# Patient Record
Sex: Male | Born: 2000 | Race: Black or African American | Hispanic: No | Marital: Single | Smoking: Never smoker
Health system: Southern US, Community
[De-identification: ages and names within clinical notes are randomized; demographics above are authoritative.]

## PROBLEM LIST (undated history)

## (undated) DIAGNOSIS — J45909 Unspecified asthma, uncomplicated: Secondary | ICD-10-CM

## (undated) DIAGNOSIS — E119 Type 2 diabetes mellitus without complications: Secondary | ICD-10-CM

## (undated) HISTORY — DX: Type 2 diabetes mellitus without complications: E11.9

---
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
## 2022-01-05 ENCOUNTER — Other Ambulatory Visit: Payer: Self-pay

## 2022-01-05 ENCOUNTER — Ambulatory Visit: Payer: Medicaid Other | Admitting: Sports Medicine

## 2022-01-27 ENCOUNTER — Ambulatory Visit: Payer: Medicaid Other | Admitting: Podiatry

## 2022-02-01 ENCOUNTER — Encounter: Payer: Self-pay | Admitting: Podiatry

## 2022-02-01 ENCOUNTER — Ambulatory Visit (INDEPENDENT_AMBULATORY_CARE_PROVIDER_SITE_OTHER): Payer: Medicaid Other | Admitting: Podiatry

## 2022-02-01 ENCOUNTER — Other Ambulatory Visit: Payer: Self-pay

## 2022-02-01 DIAGNOSIS — L600 Ingrowing nail: Secondary | ICD-10-CM

## 2022-02-01 NOTE — Progress Notes (Signed)
°  Subjective:  Patient ID: Jason Sherman, male    DOB: [birth year],   MRN: [id number]  Chief Complaint  Patient presents with   Ingrown Toenail    Pt came in today for a left ingrown toenail, which has started 5 mths ago, the pt has some swelling and drainage for the hallux.     21 y.o. male presents for concern of left great ingrown toenail that started about 6 months ago and has worsened. Has been swelling and draining. Has been soaking with no relief.  . Denies any other pedal complaints. Denies n/v/f/c.   Past Medical History:  Diagnosis Date   Asthma     Objective:  Physical Exam: Vascular: DP/PT pulses 2/4 bilateral. CFT <3 seconds. Normal hair growth on digits. No edema.  Skin. No lacerations or abrasions bilateral feet. Incurvation of lateral border of left hallux. Some erythema and edema noted no purulence.  Musculoskeletal: MMT 5/5 bilateral lower extremities in DF, PF, Inversion and Eversion. Deceased ROM in DF of ankle joint.  Neurological: Sensation intact to light touch.   Assessment:   1. Ingrown nail of great toe of left foot      Plan:  Patient was evaluated and treated and all questions answered. Patient requesting removal of ingrown nail today. Procedure below.  Discussed procedure and post procedure care and patient expressed understanding.  Will follow-up in 2 weeks for nail check or sooner if any problems arise.    Procedure:  Procedure: partial Nail Avulsion of left hallux lateral nail border.  Surgeon: Louann Sjogren, DPM  Pre-op Dx: Ingrown toenail with infection Post-op: Same  Place of Surgery: Office exam room.  Indications for surgery: Painful and ingrown toenail.    The patient is requesting removal of nail with chemical matrixectomy. Risks and complications were discussed with the patient for which they understand and written consent was obtained. Under sterile conditions a total of 3 mL of  1% lidocaine plain was infiltrated in a hallux block  fashion. Once anesthetized, the skin was prepped in sterile fashion. A tourniquet was then applied. Next the lateral aspect of hallux nail border was then sharply excised making sure to remove the entire offending nail border.  Next phenol was then applied under standard conditions and copiously irrigated. Silvadene was applied. A dry sterile dressing was applied. After application of the dressing the tourniquet was removed and there is found to be an immediate capillary refill time to the digit. The patient tolerated the procedure well without any complications. Post procedure instructions were discussed the patient for which he verbally understood. Follow-up in two weeks for nail check or sooner if any problems are to arise. Discussed signs/symptoms of infection and directed to call the office immediately should any occur or go directly to the emergency room. In the meantime, encouraged to call the office with any questions, concerns, changes symptoms.   Louann Sjogren, DPM

## 2022-02-01 NOTE — Patient Instructions (Signed)

## 2022-02-21 ENCOUNTER — Ambulatory Visit (INDEPENDENT_AMBULATORY_CARE_PROVIDER_SITE_OTHER): Payer: Medicaid Other | Admitting: Podiatry

## 2022-02-21 DIAGNOSIS — Z91199 Patient's noncompliance with other medical treatment and regimen due to unspecified reason: Secondary | ICD-10-CM

## 2022-02-21 NOTE — Progress Notes (Signed)
No show

## 2022-10-31 ENCOUNTER — Emergency Department (HOSPITAL_COMMUNITY)
Admission: EM | Admit: 2022-10-31 | Discharge: 2022-10-31 | Disposition: A | Discharge status: Home / Self Care | Payer: Medicaid Other | Attending: Emergency Medicine | Admitting: Emergency Medicine

## 2022-10-31 ENCOUNTER — Other Ambulatory Visit: Payer: Self-pay

## 2022-10-31 ENCOUNTER — Encounter (HOSPITAL_COMMUNITY): Payer: Self-pay

## 2022-10-31 DIAGNOSIS — R1084 Generalized abdominal pain: Secondary | ICD-10-CM | POA: Insufficient documentation

## 2022-10-31 DIAGNOSIS — R809 Proteinuria, unspecified: Secondary | ICD-10-CM | POA: Insufficient documentation

## 2022-10-31 DIAGNOSIS — R21 Rash and other nonspecific skin eruption: Secondary | ICD-10-CM | POA: Insufficient documentation

## 2022-10-31 LAB — COMPREHENSIVE METABOLIC PANEL
ALT: 40 U/L (ref 0–44)
AST: 22 U/L (ref 15–41)
Albumin: 4.6 g/dL (ref 3.5–5.0)
Alkaline Phosphatase: 46 U/L (ref 38–126)
Anion gap: 8 (ref 5–15)
BUN: 12 mg/dL (ref 6–20)
CO2: 26 mmol/L (ref 22–32)
Calcium: 9.4 mg/dL (ref 8.9–10.3)
Chloride: 102 mmol/L (ref 98–111)
Creatinine, Ser: 1.04 mg/dL (ref 0.61–1.24)
GFR, Estimated: >60 mL/min (ref >60)
Glucose, Bld: 111 mg/dL — ABNORMAL HIGH (ref 70–99)
Potassium: 4.1 mmol/L (ref 3.5–5.1)
Sodium: 136 mmol/L (ref 135–145)
Total Bilirubin: 0.5 mg/dL (ref 0.3–1.2)
Total Protein: 8 g/dL (ref 6.5–8.1)

## 2022-10-31 LAB — CBC WITH DIFFERENTIAL/PLATELET
Abs Immature Granulocytes: 0.06 10*3/uL (ref 0.00–0.07)
Basophils Absolute: 0 10*3/uL (ref 0.0–0.1)
Basophils Relative: 0 %
Eosinophils Absolute: 0.1 10*3/uL (ref 0.0–0.5)
Eosinophils Relative: 2 %
HCT: 50.3 % (ref 39.0–52.0)
Hemoglobin: 16.6 g/dL (ref 13.0–17.0)
Immature Granulocytes: 1 %
Lymphocytes Relative: 11 %
Lymphs Abs: 0.6 10*3/uL — ABNORMAL LOW (ref 0.7–4.0)
MCH: 28 pg (ref 26.0–34.0)
MCHC: 33 g/dL (ref 30.0–36.0)
MCV: 84.8 fL (ref 80.0–100.0)
Monocytes Absolute: 0.7 10*3/uL (ref 0.1–1.0)
Monocytes Relative: 12 %
Neutro Abs: 4.4 10*3/uL (ref 1.7–7.7)
Neutrophils Relative %: 74 %
Platelets: 188 10*3/uL (ref 150–400)
RBC: 5.93 MIL/uL — ABNORMAL HIGH (ref 4.22–5.81)
RDW: 12.5 % (ref 11.5–15.5)
WBC: 6 10*3/uL (ref 4.0–10.5)
nRBC: 0 % (ref 0.0–0.2)

## 2022-10-31 LAB — URINALYSIS, ROUTINE W REFLEX MICROSCOPIC
Bacteria, UA: NONE SEEN
Bilirubin Urine: NEGATIVE
Glucose, UA: NEGATIVE mg/dL
Hgb urine dipstick: NEGATIVE
Ketones, ur: NEGATIVE mg/dL
Leukocytes,Ua: NEGATIVE
Nitrite: NEGATIVE
Protein, ur: 30 mg/dL — AB
Specific Gravity, Urine: 1.023 (ref 1.005–1.030)
pH: 7 (ref 5.0–8.0)

## 2022-10-31 LAB — LIPASE, BLOOD: Lipase: 24 U/L (ref 11–51)

## 2022-10-31 MED ORDER — CEPHALEXIN 500 MG PO CAPS: 500.0000 mg | ORAL_CAPSULE | Freq: Four times a day (QID) | ORAL | 0 refills | Status: DC

## 2022-10-31 MED ORDER — HYDROCORTISONE 1 % EX CREA: TOPICAL_CREAM | CUTANEOUS | 0 refills | Status: AC

## 2022-10-31 NOTE — ED Triage Notes (Signed)
Pt presents to ED with multiple complaints. Pt c/o generalized abdominal pain, headache since last night. Denies N/V/D  Pt also with rash to left hand x 1 month around recent tattoo. Pt states came up after his tattoo 1 month ago, denies itching.

## 2022-10-31 NOTE — Discharge Instructions (Signed)
Please refer to the attached instructions. Take medication as directed. 

## 2022-10-31 NOTE — ED Provider Notes (Signed)
[facility] EMERGENCY DEPARTMENT Provider Note   CSN: [id number] Arrival date & time: 10/31/22  3086     History  Chief Complaint  Patient presents with   Multiple Complaints    Jason Sherman is a 21 y.o. male.  21 year old with complaint of headache and generalized abdominal pain, nausea, no emesis or diarrhea, onset after eating a large meal last night. Denies fever/chills. Also complaining of rash to left hand, onset after receiving a tattoo one month ago.   The history is provided by the patient. No language interpreter was used.  Abdominal Pain Pain location:  Generalized Pain quality: gnawing   Pain radiates to:  Does not radiate Pain severity:  Moderate Onset quality:  Gradual Duration:  12 hours Timing:  Intermittent Progression:  Waxing and waning Chronicity:  New Context: eating   Associated symptoms: nausea   Associated symptoms: no chills, no diarrhea, no fever and no vomiting   Rash Location:  Hand Hand rash location:  Dorsum of L hand Quality: blistering   Quality: not itchy, not red and not swelling   Severity:  Mild Onset quality:  Gradual Duration:  1 month Timing:  Constant Progression:  Unchanged Chronicity:  New Context comment:  Post tattoo Associated symptoms: abdominal pain and nausea   Associated symptoms: no diarrhea, no fever and not vomiting        Home Medications Prior to Admission medications   Medication Sig Start Date End Date Taking? Authorizing Provider  acetaminophen (TYLENOL) 325 MG tablet Take 1 tablet (325 mg total) by mouth every 6 (six) hours as needed (mild pain, fever >100.4). 02/17/15   Haney, Arlyss Repress A, MD  albuterol (PROVENTIL HFA;VENTOLIN HFA) 108 (90 BASE) MCG/ACT inhaler Inhale 4 puffs into the lungs every 4 (four) hours. 02/17/15   Haney, Jeanann Lewandowsky, MD  albuterol (PROVENTIL HFA;VENTOLIN HFA) 108 (90 Base) MCG/ACT inhaler Inhale 2 puffs into the lungs every 4 (four) hours as needed for wheezing or shortness of breath.  04/09/18   Ronnell Freshwater, NP  atorvastatin (LIPITOR) 40 MG tablet Take 40 mg by mouth daily. 11/23/21   [provider]  cefdinir (OMNICEF) 125 MG/5ML suspension Take 12 mLs (300 mg total) by mouth 2 (two) times daily. 02/17/15   Bonney Aid, MD  Cephalexin 500 MG tablet Take 500 mg by mouth 2 (two) times daily. 11/23/21   [provider]  cetirizine (ZYRTEC) 10 MG tablet Take 10 mg by mouth daily. 10/10/21   [provider]  ibuprofen (ADVIL) 600 MG tablet Take 600 mg by mouth 3 (three) times daily as needed. 11/23/21   [provider]  metFORMIN (GLUCOPHAGE) 500 MG tablet Take 500 mg by mouth 2 (two) times daily. 11/24/21   [provider]  predniSONE (DELTASONE) 20 MG tablet Take 3 tablets (60 mg total) by mouth daily with breakfast. 02/17/15   Haney, Alyssa A, MD  Vitamin D, Ergocalciferol, (DRISDOL) 1.25 MG (50000 UNIT) CAPS capsule Take 50,000 Units by mouth once a week. 11/23/21   [provider]      Allergies    Cashew nut oil, Penicillins, and Amoxicillin    Review of Systems   Review of Systems  Constitutional:  Negative for chills and fever.  Gastrointestinal:  Positive for abdominal pain and nausea. Negative for diarrhea and vomiting.  Skin:  Positive for rash.  All other systems reviewed and are negative.   Physical Exam Updated Vital Signs BP 128/72 (BP Location: Right Arm)  Pulse 83   Temp 99.7 F (37.6 C) (Oral)   Resp 16   Ht 5\' 8"  (1.727 m)   Wt 81.6 kg   SpO2 96%   BMI 27.37 kg/m  Physical Exam Constitutional:      Appearance: Normal appearance.  HENT:     Head: Normocephalic.     Nose: Nose normal.     Mouth/Throat:     Mouth: Mucous membranes are moist.  Eyes:     Extraocular Movements: Extraocular movements intact.     Pupils: Pupils are equal, round, and reactive to light.  Cardiovascular:     Rate and Rhythm: Normal rate.  Pulmonary:     Effort: Pulmonary effort is normal.   Abdominal:     General: There is no distension.     Palpations: Abdomen is soft.     Tenderness: There is no guarding.  Musculoskeletal:        General: Normal range of motion.  Skin:    General: Skin is warm and dry.     Findings: Rash present.  Neurological:     Mental Status: He is alert and oriented to person, place, and time.  Psychiatric:        Mood and Affect: Mood normal.        Behavior: Behavior normal.     ED Results / Procedures / Treatments   Labs (all labs ordered are listed, but only abnormal results are displayed) Labs Reviewed  COMPREHENSIVE METABOLIC PANEL - Abnormal; Notable for the following components:      Result Value   Glucose, Bld 111 (*)    All other components within normal limits  CBC WITH DIFFERENTIAL/PLATELET - Abnormal; Notable for the following components:   RBC 5.93 (*)    Lymphs Abs 0.6 (*)    All other components within normal limits  URINALYSIS, ROUTINE W REFLEX MICROSCOPIC - Abnormal; Notable for the following components:   Protein, ur 30 (*)    All other components within normal limits  LIPASE, BLOOD    EKG None  Radiology No results found.  Procedures Procedures    Medications Ordered in ED Medications - No data to display  ED Course/ Medical Decision Making/ A&P                           Medical Decision Making    1) hand rash: Sensitivity reaction versus mild underlying infection. Will treat with topical steroid and keflex  2) Abdominal pain: Patient is nontoxic, nonseptic appearing, in no apparent distress.  Labs and vitals reviewed.  Patient does not meet the SIRS or Sepsis criteria.  On repeat exam patient does not have a surgical abdomen and there are no peritoneal signs.  Patient discharged home with symptomatic treatment and given strict instructions for follow-up with their primary care physician.  I have also discussed reasons to return immediately to the ER.  Patient expresses understanding and agrees with  plan.     Amount and/or Complexity of Data Reviewed Labs: ordered. Decision-making details documented in ED Course.    Details: Mild protenuria. Negative lipase.  Risk Prescription drug management.           Final Clinical Impression(s) / ED Diagnoses Final diagnoses:  Generalized abdominal pain  Rash and nonspecific skin eruption    Rx / DC Orders ED Discharge Orders          Ordered    cephALEXin (KEFLEX) 500 MG capsule  4 times daily        10/31/22 1137    hydrocortisone cream 1 %        10/31/22 1137              Felicie Morn, NP 10/31/22 Ernestina Columbia    Terrilee Files, MD 11/01/22 1715

## 2023-01-04 ENCOUNTER — Ambulatory Visit: Payer: Self-pay | Admitting: Internal Medicine

## 2023-02-02 ENCOUNTER — Ambulatory Visit: Payer: Self-pay | Admitting: Internal Medicine

## 2023-04-30 ENCOUNTER — Ambulatory Visit
Admission: EM | Admit: 2023-04-30 | Discharge: 2023-04-30 | Disposition: A | Discharge status: Home / Self Care | Payer: Self-pay | Attending: Nurse Practitioner | Admitting: Nurse Practitioner

## 2023-04-30 DIAGNOSIS — L02214 Cutaneous abscess of groin: Secondary | ICD-10-CM

## 2023-04-30 MED ORDER — SULFAMETHOXAZOLE-TRIMETHOPRIM 800-160 MG PO TABS: 1.0000 | ORAL_TABLET | Freq: Two times a day (BID) | ORAL | 0 refills | Status: AC

## 2023-04-30 NOTE — ED Provider Notes (Signed)
[facility]    CSN: [id number] Arrival date & time: 04/30/23  1104      History   Chief Complaint Chief Complaint  Patient presents with   Mass    HPI Jason Sherman is a 22 y.o. male.   Patient presents today with a swollen and painful area to left thigh.  He denies redness, drainage, or worsening swelling.  Denies fevers or nausea/vomiting.  Patient reports he applied a warm compress and this seemed to help a little bit.  Thinks it has gone down some in size.  No recent change in detergents, soaps, personal care products.  Patient denies antibiotic use in the past 90 days.    Past Medical History:  Diagnosis Date   Asthma    Diabetes mellitus without complication [facility])     Patient Active Problem List   Diagnosis Date Noted   CAP (community acquired pneumonia) 02/17/2015   Pneumonia 02/17/2015   Asthma exacerbation    Hypoxia     History reviewed. No pertinent surgical history.     Home Medications    Prior to Admission medications   Medication Sig Start Date End Date Taking? Authorizing Provider  hydrocortisone cream 1 % Apply to affected area 2 times daily 10/31/22  Yes Felicie Morn, NP  sulfamethoxazole-trimethoprim (BACTRIM DS) 800-160 MG tablet Take 1 tablet by mouth 2 (two) times daily for 7 days. 04/30/23 05/07/23 Yes Valentino Nose, NP  acetaminophen (TYLENOL) 325 MG tablet Take 1 tablet (325 mg total) by mouth every 6 (six) hours as needed (mild pain, fever >100.4). 02/17/15   Haney, Jeanann Lewandowsky, MD  albuterol (PROVENTIL HFA;VENTOLIN HFA) 108 (90 Base) MCG/ACT inhaler Inhale 2 puffs into the lungs every 4 (four) hours as needed for wheezing or shortness of breath. Patient not taking: Reported on 10/31/2022 04/09/18   Ronnell Freshwater, NP  atorvastatin (LIPITOR) 40 MG tablet Take 40 mg by mouth daily. Patient not taking: Reported on 10/31/2022 11/23/21   [provider]  metFORMIN (GLUCOPHAGE) 500 MG tablet Take 500 mg by  mouth 2 (two) times daily. Patient not taking: Reported on 10/31/2022 11/24/21   [provider]    Family History History reviewed. No pertinent family history.  Social History Social History   Tobacco Use   Smoking status: Never   Smokeless tobacco: Never  Substance Use Topics   Alcohol use: Yes   Drug use: No     Allergies   Cashew nut oil, Penicillins, and Amoxicillin   Review of Systems Review of Systems Per HPI  Physical Exam Triage Vital Signs ED Triage Vitals [04/30/23 1314]  Enc Vitals Group     BP 137/75     Pulse Rate (!) 55     Resp 16     Temp 97.8 F (36.6 C)     Temp Source Oral     SpO2 98 %     Weight      Height      Head Circumference      Peak Flow      Pain Score 3     Pain Loc      Pain Edu?      Excl. in GC?    No data found.  Updated Vital Signs BP 137/75 (BP Location: Right Arm)   Pulse (!) 55   Temp 97.8 F (36.6 C) (Oral)   Resp 16   SpO2 98%   Visual Acuity Right Eye Distance:   Left Eye Distance:  Bilateral Distance:    Right Eye Near:   Left Eye Near:    Bilateral Near:     Physical Exam Vitals and nursing note reviewed.  Constitutional:      General: He is not in acute distress.    Appearance: Normal appearance. He is not toxic-appearing.  HENT:     Mouth/Throat:     Mouth: Mucous membranes are moist.     Pharynx: Oropharynx is clear.  Pulmonary:     Effort: Pulmonary effort is normal. No respiratory distress.  Skin:    General: Skin is warm and dry.     Capillary Refill: Capillary refill takes less than 2 seconds.     Findings: Abscess present.     Comments: Flesh colored fluctuant, tender to touch mass to left groin approximately 0.5 x 0.5 cm.  No active drainage, erythema, warmth.  Neurological:     Mental Status: He is alert and oriented to person, place, and time.  Psychiatric:        Behavior: Behavior is cooperative.      UC Treatments / Results  Labs (all labs ordered are  listed, but only abnormal results are displayed) Labs Reviewed - No data to display  EKG   Radiology No results found.  Procedures Procedures (including critical care time)  Medications Ordered in UC Medications - No data to display  Initial Impression / Assessment and Plan / UC Course  I have reviewed the triage vital signs and the nursing notes.  Pertinent labs & imaging results that were available during my care of the patient were reviewed by me and considered in my medical decision making (see chart for details).   Patient is well-appearing, normotensive, afebrile, not tachycardic, not tachypneic, oxygenating well on room air.    1. Abscess of left groin Offered I&D vs. beginning antibiotic therapy and close monitoring Patient elects to start oral antibiotic therapy and continue warm compresses, closely monitor for worsening symptoms and will return if so for I&D Supportive care discussed Strict ER and return precautions also discussed with patient  The patient was given the opportunity to ask questions.  All questions answered to their satisfaction.  The patient is in agreement to this plan.    Final Clinical Impressions(s) / UC Diagnoses   Final diagnoses:  Abscess of left groin     Discharge Instructions      Please continue the warm compresses to the abscess to help with pain and allow it to drain.  Please also start the Bactrim and take it as prescribed to treat infection under the skin.  Please return to be seen if the area starts to swell worse or becomes more painful.     ED Prescriptions     Medication Sig Dispense Auth. Provider   sulfamethoxazole-trimethoprim (BACTRIM DS) 800-160 MG tablet Take 1 tablet by mouth 2 (two) times daily for 7 days. 14 tablet Valentino Nose, NP      PDMP not reviewed this encounter.   Valentino Nose, NP 04/30/23 1349

## 2023-04-30 NOTE — Discharge Instructions (Signed)
Please continue the warm compresses to the abscess to help with pain and allow it to drain.  Please also start the Bactrim and take it as prescribed to treat infection under the skin.  Please return to be seen if the area starts to swell worse or becomes more painful.

## 2023-04-30 NOTE — ED Triage Notes (Signed)
Pt states he noticed a boil on his left thigh a week ago. Pt states it has not gotten any bigger and no redness or warmth to site with no drainage. Does state it is painful to the touch.

## 2023-11-09 ENCOUNTER — Other Ambulatory Visit: Payer: Self-pay

## 2023-11-09 ENCOUNTER — Encounter (HOSPITAL_COMMUNITY): Payer: Self-pay

## 2023-11-09 ENCOUNTER — Emergency Department (HOSPITAL_COMMUNITY)
Admission: EM | Admit: 2023-11-09 | Discharge: 2023-11-09 | Disposition: A | Discharge status: Home / Self Care | Payer: Self-pay | Attending: Emergency Medicine | Admitting: Emergency Medicine

## 2023-11-09 DIAGNOSIS — A749 Chlamydial infection, unspecified: Secondary | ICD-10-CM | POA: Insufficient documentation

## 2023-11-09 DIAGNOSIS — N342 Other urethritis: Secondary | ICD-10-CM

## 2023-11-09 LAB — URINALYSIS, ROUTINE W REFLEX MICROSCOPIC
Bacteria, UA: NONE SEEN
Bilirubin Urine: NEGATIVE
Glucose, UA: NEGATIVE mg/dL
Ketones, ur: NEGATIVE mg/dL
Nitrite: NEGATIVE
Protein, ur: NEGATIVE mg/dL
Specific Gravity, Urine: 1.004 — ABNORMAL LOW (ref 1.005–1.030)
WBC, UA: >50 WBC/hpf (ref 0–5)
pH: 7 (ref 5.0–8.0)

## 2023-11-09 MED ORDER — DOXYCYCLINE HYCLATE 100 MG PO TABS
100.0000 mg | ORAL_TABLET | Freq: Once | ORAL | Status: AC
  Administered 2023-11-09: 100 mg via ORAL
  Filled 2023-11-09: qty 1

## 2023-11-09 MED ORDER — DOXYCYCLINE HYCLATE 100 MG PO CAPS: 100.0000 mg | ORAL_CAPSULE | Freq: Two times a day (BID) | ORAL | 0 refills | Status: AC

## 2023-11-09 MED ORDER — CEFTRIAXONE SODIUM 500 MG IJ SOLR
500.0000 mg | Freq: Once | INTRAMUSCULAR | Status: AC
  Administered 2023-11-09: 500 mg via INTRAMUSCULAR
  Filled 2023-11-09: qty 500

## 2023-11-09 MED ORDER — STERILE WATER FOR INJECTION IJ SOLN
INTRAMUSCULAR | Status: AC
  Administered 2023-11-09: 1 mL
  Filled 2023-11-09: qty 10

## 2023-11-09 NOTE — ED Provider Notes (Signed)
[facility] EMERGENCY DEPARTMENT AT [facility] Provider Note   CSN: [id number] Arrival date & time: 11/09/23  2018     History  Chief Complaint  Patient presents with   Exposure to STD    Jason Sherman is a 22 y.o. male.  Presents for urethral discharge that he noticed today while at work.  Denies any dysuria or fever, no flank pain, no nausea or vomiting, no other symptoms.  He is sexually active with 1 partner only, does not use condoms.   Exposure to STD       Home Medications Prior to Admission medications   Medication Sig Start Date End Date Taking? Authorizing Provider  acetaminophen (TYLENOL) 325 MG tablet Take 1 tablet (325 mg total) by mouth every 6 (six) hours as needed (mild pain, fever >100.4). 02/17/15   Haney, Jeanann Lewandowsky, MD  albuterol (PROVENTIL HFA;VENTOLIN HFA) 108 (90 Base) MCG/ACT inhaler Inhale 2 puffs into the lungs every 4 (four) hours as needed for wheezing or shortness of breath. Patient not taking: Reported on 10/31/2022 04/09/18   Ronnell Freshwater, NP  atorvastatin (LIPITOR) 40 MG tablet Take 40 mg by mouth daily. Patient not taking: Reported on 10/31/2022 11/23/21   [provider]  hydrocortisone cream 1 % Apply to affected area 2 times daily 10/31/22   Felicie Morn, NP  metFORMIN (GLUCOPHAGE) 500 MG tablet Take 500 mg by mouth 2 (two) times daily. Patient not taking: Reported on 10/31/2022 11/24/21   [provider]      Allergies    Cashew nut oil, Penicillins, and Amoxicillin    Review of Systems   Review of Systems  Physical Exam Updated Vital Signs BP (!) 143/75   Pulse 68   Temp 98.8 F (37.1 C) (Oral)   Resp 14   Ht 5\' 8"  (1.727 m)   Wt 79.4 kg   SpO2 100%   BMI 26.61 kg/m  Physical Exam Vitals and nursing note reviewed. Exam conducted with a chaperone present.  Constitutional:      General: He is not in acute distress.    Appearance: He is well-developed.  HENT:     Head: Normocephalic  and atraumatic.     Mouth/Throat:     Mouth: Mucous membranes are moist.  Eyes:     Extraocular Movements: Extraocular movements intact.     Conjunctiva/sclera: Conjunctivae normal.     Pupils: Pupils are equal, round, and reactive to light.  Cardiovascular:     Rate and Rhythm: Normal rate and regular rhythm.     Heart sounds: No murmur heard. Pulmonary:     Effort: Pulmonary effort is normal. No respiratory distress.     Breath sounds: Normal breath sounds.  Abdominal:     Palpations: Abdomen is soft.     Tenderness: There is no abdominal tenderness.  Genitourinary:    Penis: Circumcised. Discharge present. No erythema, tenderness, swelling or lesions.      Testes: Normal.  Musculoskeletal:        General: No swelling.     Cervical back: Neck supple.  Skin:    General: Skin is warm and dry.     Capillary Refill: Capillary refill takes less than 2 seconds.  Neurological:     General: No focal deficit present.     Mental Status: He is alert and oriented to person, place, and time.  Psychiatric:        Mood and Affect: Mood normal.     ED Results /  Procedures / Treatments   Labs (all labs ordered are listed, but only abnormal results are displayed) Labs Reviewed  URINALYSIS, ROUTINE W REFLEX MICROSCOPIC  GC/CHLAMYDIA PROBE AMP ([facility]) NOT AT [facility]    EKG None  Radiology No results found.  Procedures Procedures    Medications Ordered in ED Medications  cefTRIAXone (ROCEPHIN) injection 500 mg (has no administration in time range)  doxycycline (VIBRA-TABS) tablet 100 mg (has no administration in time range)    ED Course/ Medical Decision Making/ A&P                                 Medical Decision Making DDx: Urethritis, UTI, other ED course: Patient here for urethral discharge, noted to have discharge on exam.  He is having no associated symptoms.  He reports only 1 sexual partner who is male.  Will treat empirically for GC chlamydia advised  outpatient follow-up for more complete testing, discussed safe sex, discussed having his partner also tested and treated.  Discussed follow-up and return precautions.  Amount and/or Complexity of Data Reviewed Labs: ordered.  Risk Prescription drug management.           Final Clinical Impression(s) / ED Diagnoses Final diagnoses:  None    Rx / DC Orders ED Discharge Orders     None         Josem Kaufmann 11/09/23 2302    Vanetta Mulders, MD 11/13/23 [phone]

## 2023-11-09 NOTE — ED Triage Notes (Signed)
Yellow discharge from penis started today Had unprotected sex a few days ago Knows partner Unknown what he was exposed to

## 2023-11-09 NOTE — Discharge Instructions (Addendum)
You were seen in the ER today for penile discharge.  This is likely due to a sexually transmitted infection.  You are being treated today for gonorrhea and chlamydia.  It is important to finish the entire course of antibiotics.  The doxycycline can make your skin more sensitive to sun so make sure you are wearing long sleeves when outside or use sunscreen.  Make sure your partner also gets tested and treated, do not have unprotected sex until you have been fully treated for a week.  Make sure you finish the full course of antibiotics without skipping any doses.

## 2023-11-12 LAB — GC/CHLAMYDIA PROBE AMP (~~LOC~~) NOT AT ARMC
Chlamydia: NEGATIVE
Comment: NEGATIVE
Comment: NORMAL
Neisseria Gonorrhea: POSITIVE — AB

## 2024-02-22 ENCOUNTER — Other Ambulatory Visit: Payer: Self-pay

## 2024-02-22 ENCOUNTER — Emergency Department (HOSPITAL_COMMUNITY): Payer: MEDICAID

## 2024-02-22 ENCOUNTER — Emergency Department (HOSPITAL_COMMUNITY)
Admission: EM | Admit: 2024-02-22 | Discharge: 2024-02-22 | Disposition: A | Discharge status: Home / Self Care | Payer: MEDICAID | Attending: Emergency Medicine | Admitting: Emergency Medicine

## 2024-02-22 ENCOUNTER — Encounter (HOSPITAL_COMMUNITY): Payer: Self-pay | Admitting: Emergency Medicine

## 2024-02-22 DIAGNOSIS — Z79899 Other long term (current) drug therapy: Secondary | ICD-10-CM | POA: Insufficient documentation

## 2024-02-22 DIAGNOSIS — I309 Acute pericarditis, unspecified: Secondary | ICD-10-CM

## 2024-02-22 DIAGNOSIS — M549 Dorsalgia, unspecified: Secondary | ICD-10-CM | POA: Insufficient documentation

## 2024-02-22 DIAGNOSIS — M542 Cervicalgia: Secondary | ICD-10-CM | POA: Insufficient documentation

## 2024-02-22 DIAGNOSIS — R079 Chest pain, unspecified: Secondary | ICD-10-CM | POA: Insufficient documentation

## 2024-02-22 LAB — COMPREHENSIVE METABOLIC PANEL
ALT: 14 U/L (ref 0–44)
AST: 13 U/L — ABNORMAL LOW (ref 15–41)
Albumin: 4.5 g/dL (ref 3.5–5.0)
Alkaline Phosphatase: 44 U/L (ref 38–126)
Anion gap: 9 (ref 5–15)
BUN: 8 mg/dL (ref 6–20)
CO2: 28 mmol/L (ref 22–32)
Calcium: 9.5 mg/dL (ref 8.9–10.3)
Chloride: 100 mmol/L (ref 98–111)
Creatinine, Ser: 0.99 mg/dL (ref 0.61–1.24)
GFR, Estimated: >60 mL/min (ref >60)
Glucose, Bld: 91 mg/dL (ref 70–99)
Potassium: 4 mmol/L (ref 3.5–5.1)
Sodium: 137 mmol/L (ref 135–145)
Total Bilirubin: 0.3 mg/dL (ref 0.0–1.2)
Total Protein: 7.7 g/dL (ref 6.5–8.1)

## 2024-02-22 LAB — CBC WITH DIFFERENTIAL/PLATELET
Abs Immature Granulocytes: 0.03 10*3/uL (ref 0.00–0.07)
Basophils Absolute: 0 10*3/uL (ref 0.0–0.1)
Basophils Relative: 1 %
Eosinophils Absolute: 0.2 10*3/uL (ref 0.0–0.5)
Eosinophils Relative: 3 %
HCT: 44.7 % (ref 39.0–52.0)
Hemoglobin: 15.1 g/dL (ref 13.0–17.0)
Immature Granulocytes: 1 %
Lymphocytes Relative: 22 %
Lymphs Abs: 1.4 10*3/uL (ref 0.7–4.0)
MCH: 29.3 pg (ref 26.0–34.0)
MCHC: 33.8 g/dL (ref 30.0–36.0)
MCV: 86.8 fL (ref 80.0–100.0)
Monocytes Absolute: 0.5 10*3/uL (ref 0.1–1.0)
Monocytes Relative: 8 %
Neutro Abs: 4.4 10*3/uL (ref 1.7–7.7)
Neutrophils Relative %: 65 %
Platelets: 237 10*3/uL (ref 150–400)
RBC: 5.15 MIL/uL (ref 4.22–5.81)
RDW: 12.1 % (ref 11.5–15.5)
WBC: 6.6 10*3/uL (ref 4.0–10.5)
nRBC: 0 % (ref 0.0–0.2)

## 2024-02-22 LAB — LIPASE, BLOOD: Lipase: 24 U/L (ref 11–51)

## 2024-02-22 LAB — TROPONIN I (HIGH SENSITIVITY): Troponin I (High Sensitivity): 3 ng/L (ref <18)

## 2024-02-22 MED ORDER — ALUM & MAG HYDROXIDE-SIMETH 200-200-20 MG/5ML PO SUSP
30.0000 mL | Freq: Once | ORAL | Status: AC
  Administered 2024-02-22: 30 mL via ORAL
  Filled 2024-02-22: qty 30

## 2024-02-22 MED ORDER — COLCHICINE 0.6 MG PO TABS: 0.6000 mg | ORAL_TABLET | Freq: Two times a day (BID) | ORAL | 0 refills | Status: AC

## 2024-02-22 MED ORDER — IBUPROFEN 800 MG PO TABS
800.0000 mg | ORAL_TABLET | Freq: Once | ORAL | Status: AC
  Administered 2024-02-22: 800 mg via ORAL
  Filled 2024-02-22: qty 1

## 2024-02-22 MED ORDER — COLCHICINE 0.6 MG PO TABS
0.6000 mg | ORAL_TABLET | Freq: Once | ORAL | Status: AC
  Administered 2024-02-22: 0.6 mg via ORAL
  Filled 2024-02-22: qty 1

## 2024-02-22 MED ORDER — IBUPROFEN 600 MG PO TABS: 600.0000 mg | ORAL_TABLET | Freq: Three times a day (TID) | ORAL | 0 refills | Status: AC

## 2024-02-22 NOTE — ED Notes (Signed)
 Patient discharged. Provider spoke to patient. Paperwork given to patient and reviewed. Pt verbalized understanding. VSS. A+Ox4. Patient ambulated out of the ER with steady, independent gait. No iv in place.

## 2024-02-22 NOTE — Discharge Instructions (Signed)
 Please follow-up closely with cardiology on an outpatient basis.  Please take all medications as directed.  Return to emergency department immediately for any new or worsening symptoms.

## 2024-02-22 NOTE — ED Provider Notes (Signed)
 [facility] AT [facility] Provider Note   CSN: [id number] Arrival date & time: 02/22/24  1117     History  Chief Complaint  Patient presents with   Chest Pain   Back Pain   Neck Pain    Jason Sherman is a 23 y.o. male.  Patient is a 23 year old male who presents emergency department with a chief complaint of pain to the chest, back and neck.  He notes his symptoms started last night.  He notes that he has had no associated shortness of breath.  Symptoms are not exertional or positional in nature.  He denies any associated numbness, paresthesias or weakness.  He denies any dizziness, lightheadedness or syncope.  He denies any known history of cardiac or pulmonary disease.  He does note that he recently recovered from influenza which she was diagnosed with 2 weeks ago.  He denies any abnormal rashes.  He denies any direct sore throat.   Chest Pain Associated symptoms: back pain   Back Pain Associated symptoms: chest pain   Neck Pain Associated symptoms: chest pain        Home Medications Prior to Admission medications   Medication Sig Start Date End Date Taking? Authorizing Provider  acetaminophen (TYLENOL) 325 MG tablet Take 1 tablet (325 mg total) by mouth every 6 (six) hours as needed (mild pain, fever >100.4). 02/17/15   Haney, Jeanann Lewandowsky, MD  albuterol (PROVENTIL HFA;VENTOLIN HFA) 108 (90 Base) MCG/ACT inhaler Inhale 2 puffs into the lungs every 4 (four) hours as needed for wheezing or shortness of breath. Patient not taking: Reported on 10/31/2022 04/09/18   Ronnell Freshwater, NP  atorvastatin (LIPITOR) 40 MG tablet Take 40 mg by mouth daily. Patient not taking: Reported on 10/31/2022 11/23/21   [provider]  hydrocortisone cream 1 % Apply to affected area 2 times daily 10/31/22   Felicie Morn, NP  metFORMIN (GLUCOPHAGE) 500 MG tablet Take 500 mg by mouth 2 (two) times daily. Patient not taking: Reported on 10/31/2022  11/24/21   [provider]      Allergies    Cashew nut oil, Penicillins, and Amoxicillin    Review of Systems   Review of Systems  Cardiovascular:  Positive for chest pain.  Musculoskeletal:  Positive for back pain and neck pain.    Physical Exam Updated Vital Signs BP 123/62 (BP Location: Right Arm)   Pulse 63   Temp 99.2 F (37.3 C) (Oral)   Resp 16   Ht 5\' 8"  (1.727 m)   Wt 81.6 kg   SpO2 100%   BMI 27.37 kg/m  Physical Exam Vitals and nursing note reviewed.  Constitutional:      Appearance: Normal appearance.  HENT:     Head: Normocephalic and atraumatic.     Nose: Nose normal.     Mouth/Throat:     Mouth: Mucous membranes are moist. No oral lesions or angioedema.     Pharynx: No pharyngeal swelling, oropharyngeal exudate, posterior oropharyngeal erythema or uvula swelling.  Eyes:     Extraocular Movements: Extraocular movements intact.     Conjunctiva/sclera: Conjunctivae normal.     Pupils: Pupils are equal, round, and reactive to light.  Cardiovascular:     Rate and Rhythm: Normal rate and regular rhythm.     Pulses: Normal pulses.     Heart sounds: Normal heart sounds. No murmur heard. Pulmonary:     Effort: Pulmonary effort is normal. No tachypnea or respiratory distress.  Breath sounds: Normal breath sounds. No stridor. No decreased breath sounds, wheezing, rhonchi or rales.  Chest:     Chest wall: No tenderness.  Abdominal:     General: Abdomen is flat. Bowel sounds are normal.     Palpations: Abdomen is soft.     Tenderness: There is no abdominal tenderness. There is no guarding.  Musculoskeletal:        General: Normal range of motion.     Cervical back: Normal range of motion and neck supple.  Skin:    General: Skin is warm and dry.  Neurological:     General: No focal deficit present.     Mental Status: He is alert and oriented to person, place, and time. Mental status is at baseline.  Psychiatric:        Mood and Affect: Mood  normal.        Behavior: Behavior normal.        Thought Content: Thought content normal.        Judgment: Judgment normal.     ED Results / Procedures / Treatments   Labs (all labs ordered are listed, but only abnormal results are displayed) Labs Reviewed  CBC WITH DIFFERENTIAL/PLATELET  COMPREHENSIVE METABOLIC PANEL  LIPASE, BLOOD  TROPONIN I (HIGH SENSITIVITY)    EKG None  Radiology No results found.  Procedures Procedures    Medications Ordered in ED Medications  alum & mag hydroxide-simeth (MAALOX/MYLANTA) 200-200-20 MG/5ML suspension 30 mL (has no administration in time range)    ED Course/ Medical Decision Making/ A&P                                 Medical Decision Making Amount and/or Complexity of Data Reviewed Labs: ordered. Radiology: ordered.  Risk OTC drugs. Prescription drug management.   This patient presents to the ED for concern of chest pain differential diagnosis includes ACS, pulmonary embolus, pericarditis, myocarditis, endocarditis, aortic aneurysm or dissection, acute viral syndrome, pneumonia    Additional history obtained:  Additional history obtained from mother External records from outside source obtained and reviewed including none   Lab Tests:  I Ordered, and personally interpreted labs.  The pertinent results include: Normal troponin, normal electrolytes, no changes in kidney function or liver function   Imaging Studies ordered:  I ordered imaging studies including chest x-ray I independently visualized and interpreted imaging which showed no acute cardiopulmonary process I agree with the radiologist interpretation   Medicines ordered and prescription drug management:  I ordered medication including Motrin, colchicine for carditis Reevaluation of the patient after these medicines showed that the patient improved I have reviewed the patients home medicines and have made adjustments as needed   Problem List / ED  Course:  Patient is doing well at this time and is stable for discharge home.  Discussed with patient and mother that symptoms and EKG are most consistent with pericarditis at this time.  Do not suspect myocarditis as patient has a troponin that was within normal limits and has no associated edema.  Do not SPECT ACS at this time as EKG has no acute ischemic changes he has a negative troponin.  Do not suspect pulmonary embolus, aortic aneurysm or dissection.  Chest x-ray demonstrates no indication for pneumonia, pneumothorax, hemothorax.  Will treat patient with Motrin and colchicine at this time and recommend close follow-up with cardiology.  Strict turn precautions were discussed for any new or worsening  symptoms.  Patient voiced understanding had no additional questions.   Social Determinants of Health:  None           Final Clinical Impression(s) / ED Diagnoses Final diagnoses:  None    Rx / DC Orders ED Discharge Orders     None         Kathlen Mody 02/22/24 1535    Bethann Berkshire, MD 02/24/24 1116

## 2024-02-22 NOTE — ED Triage Notes (Signed)
 Pt reports he was laying down and began to have sharp pain in his chest, up to his throat and strait back into his back. Pt denies any unusual activity, injury or spicy foods. Pt does vape but denies any tobacco or drug use. Pt recently recovered from the flu.
